# Patient Record
Sex: Female | Born: 1990 | Race: White | Hispanic: No | Marital: Single
Health system: Midwestern US, Community
[De-identification: ages and names within clinical notes are randomized; demographics above are authoritative.]

## PROBLEM LIST (undated history)

## (undated) DIAGNOSIS — B182 Chronic viral hepatitis C: Secondary | ICD-10-CM

---

## 2015-10-12 ENCOUNTER — Encounter (HOSPITAL_COMMUNITY): Payer: Self-pay | Admitting: *Deleted

## 2015-10-12 ENCOUNTER — Emergency Department (HOSPITAL_COMMUNITY)
Admission: EM | Admit: 2015-10-12 | Discharge: 2015-10-13 | Discharge status: Against Medical Advice | Payer: Self-pay | Attending: Emergency Medicine | Admitting: Emergency Medicine

## 2015-10-12 DIAGNOSIS — L03113 Cellulitis of right upper limb: Secondary | ICD-10-CM | POA: Insufficient documentation

## 2015-10-12 DIAGNOSIS — F1910 Other psychoactive substance abuse, uncomplicated: Secondary | ICD-10-CM | POA: Insufficient documentation

## 2015-10-12 DIAGNOSIS — J209 Acute bronchitis, unspecified: Secondary | ICD-10-CM | POA: Insufficient documentation

## 2015-10-12 DIAGNOSIS — F17200 Nicotine dependence, unspecified, uncomplicated: Secondary | ICD-10-CM | POA: Insufficient documentation

## 2015-10-12 HISTORY — DX: Chronic viral hepatitis C: B18.2

## 2015-10-12 MED ORDER — VANCOMYCIN HCL IN DEXTROSE 1-5 GM/200ML-% IV SOLN
1000.0000 mg | Freq: Once | INTRAVENOUS | Status: AC
  Administered 2015-10-13: 1000 mg via INTRAVENOUS
  Filled 2015-10-12: qty 200

## 2015-10-12 MED ORDER — IPRATROPIUM BROMIDE 0.02 % IN SOLN
0.5000 mg | Freq: Once | RESPIRATORY_TRACT | Status: AC
  Administered 2015-10-13: 0.5 mg via RESPIRATORY_TRACT
  Filled 2015-10-12: qty 2.5

## 2015-10-12 NOTE — ED Provider Notes (Signed)
CSN: [id number]     Arrival date & time 10/12/15  2130 History  By signing my name below, I, Phillis Haggis, attest that this documentation has been prepared under the direction and in the presence of Dione Booze, MD. Electronically Signed: Phillis Haggis, ED Scribe. 10/12/2015. 11:38 PM.   Chief Complaint  Patient presents with  . Wound Check   The history is provided by the patient. No language interpreter was used.  HPI Comments: Carla Chambers is a 25 y.o. female with a hx of Hepatitis C who presents to the Emergency Department complaining of a gradually worsening, red wound and rash to the inner right elbow onset 3 days ago. Pt reports worsening pain with bending her elbow. She rates her pain 7/10 at its worst. She has tried to squeeze the area, resulting in yellow, purulent drainage from the wound. Pt is an IV drug user; she states that her last use was 3 days ago. She reports chills and nausea, but believes this is from stopping the IV drug use. She states that she is trying to detox. She states that she has been having a gradually worsening productive cough with black sputum. She reports that she was diagnosed with bronchitis, but it has not gotten better. Pt is a smoker, 0.5 ppd. She denies fever. Pt does not have a PCP; she states that she is from [place]. Pt is allergic to Albuterol, Prednisone, and Xopenex.   Past Medical History  Diagnosis Date  . Hep C w/o coma, chronic (HCC)    History reviewed. No pertinent past surgical history. No family history on file. Social History  Substance Use Topics  . Smoking status: Current Every Day Smoker  . Smokeless tobacco: None  . Alcohol Use: No   OB History    No data available     Review of Systems  Constitutional: Positive for chills. Negative for fever.  Respiratory: Positive for cough.   Gastrointestinal: Positive for nausea.  Skin: Positive for rash and wound.  All other systems reviewed and are negative.  Allergies   Albuterol; Prednisone; Remeron; Xopenex; and Zoloft  Home Medications   Prior to Admission medications   Not on File   BP 138/90 mmHg  Pulse 96  Temp(Src) 98.4 F (36.9 C) (Oral)  Resp 18  Ht  (1.626 m)  Wt 136 lb 7 oz (61.888 kg)  BMI 23.41 kg/m2  SpO2 100%  LMP 08/12/2015 Physical Exam  Constitutional: She is oriented to person, place, and time. She appears well-developed and well-nourished.  HENT:  Head: Normocephalic and atraumatic.  Eyes: EOM are normal. Pupils are equal, round, and reactive to light.  Neck: Normal range of motion. Neck supple. No JVD present.  Cardiovascular: Normal rate, regular rhythm and normal heart sounds.  Exam reveals no gallop and no friction rub.   No murmur heard. Pulmonary/Chest: Effort normal. She has wheezes. She has no rales.  Exhalation phase wheezing with forced exhalation  Abdominal: Soft. Bowel sounds are normal. She exhibits no distension and no mass. There is no tenderness.  Musculoskeletal: Normal range of motion. She exhibits no edema.  Lymphadenopathy:    She has no cervical adenopathy.  Neurological: She is alert and oriented to person, place, and time. No cranial nerve deficit. She exhibits normal muscle tone. Coordination normal.  Skin: Skin is warm and dry.  3x2 cm erythematous area to right antecubital space, crusted and broken skin in central portion, slightly heaped up margins, no crepitus or lymphangitic streaks; needle  tracks present over veins of right forearm; no axillary adenopathy  Psychiatric: She has a normal mood and affect. Her behavior is normal.  Nursing note and vitals reviewed.   ED Course  Procedures (including critical care time) DIAGNOSTIC STUDIES: Oxygen Saturation is 100% on RA, normal by my interpretation.    COORDINATION OF CARE: 11:36 PM-Discussed treatment plan which includes labs, anti-biotics and x-ray with pt at bedside and pt agreed to plan.   Labs Review Results for orders placed or  performed during the hospital encounter of 10/12/15  CBC with Differential  Result Value Ref Range   WBC 6.2 4.0 - 10.5 K/uL   RBC 4.85 3.87 - 5.11 MIL/uL   Hemoglobin 14.6 12.0 - 15.0 g/dL   HCT 29.544.5 28.436.0 - 13.246.0 %   MCV 91.8 78.0 - 100.0 fL   MCH 30.1 26.0 - 34.0 pg   MCHC 32.8 30.0 - 36.0 g/dL   RDW 44.012.3 10.211.5 - 72.515.5 %   Platelets 271 150 - 400 K/uL   Neutrophils Relative % 56 %   Neutro Abs 3.5 1.7 - 7.7 K/uL   Lymphocytes Relative 31 %   Lymphs Abs 1.9 0.7 - 4.0 K/uL   Monocytes Relative 11 %   Monocytes Absolute 0.7 0.1 - 1.0 K/uL   Eosinophils Relative 2 %   Eosinophils Absolute 0.1 0.0 - 0.7 K/uL   Basophils Relative 0 %   Basophils Absolute 0.0 0.0 - 0.1 K/uL  Basic metabolic panel  Result Value Ref Range   Sodium 136 135 - 145 mmol/L   Potassium 4.0 3.5 - 5.1 mmol/L   Chloride 105 101 - 111 mmol/L   CO2 24 22 - 32 mmol/L   Glucose, Bld 83 65 - 99 mg/dL   BUN <5 (L) 6 - 20 mg/dL   Creatinine, Ser 3.660.63 0.44 - 1.00 mg/dL   Calcium 9.1 8.9 - 44.010.3 mg/dL   GFR calc non Af Amer >60 >60 mL/min   GFR calc Af Amer >60 >60 mL/min   Anion gap 7 5 - 15  Sedimentation rate  Result Value Ref Range   Sed Rate 15 0 - 22 mm/hr   Imaging Review Dg Chest 2 View  10/13/2015  CLINICAL DATA:  Cough for 1 month. EXAM: CHEST  2 VIEW COMPARISON:  None. FINDINGS: The heart size and mediastinal contours are within normal limits. Both lungs are clear. The visualized skeletal structures are unremarkable. IMPRESSION: No active cardiopulmonary disease. Electronically Signed   By: Ellery Plunkaniel R Mitchell M.D.   On: 10/13/2015 00:43   Dg Elbow Complete Right  10/13/2015  CLINICAL DATA:  IV drug use.  Elbow wound. EXAM: RIGHT ELBOW - COMPLETE 3+ VIEW COMPARISON:  None. FINDINGS: No soft tissue gas or soft tissue foreign body. Bones and articulations are normal. IMPRESSION: Negative. Electronically Signed   By: Ellery Plunkaniel R Mitchell M.D.   On: 10/13/2015 00:43   I have personally reviewed and evaluated these  images and lab results as part of my medical decision-making.   MDM   Final diagnoses:  Cellulitis of right arm  IV drug abuse  Acute bronchitis, unspecified organism    Lesion in the right forearm appears to be area of cellulitis. Report of pus coming out of it originally is suggesting that there was an abscess there originally. No evidence of abscess currently. She is sent for x-rays to rule out gas-forming in infection. Cough appears to be bronchitic. She relates history of allergy to albuterol but has not received it  since she was 25 years old. On that occasion, she did have a strep infection and the reaction was supposed to be a rash. I question whether the rash I was actually from strep and not from the albuterol. Chest x-rays obtained showing no evidence of pneumonia. She was to receive a dose of IV vancomycin before starting her on doxycycline. However, patient got up and left the department before I had a chance to talk with her and she never received any antibiotics. Because she left without giving me a chance to talk with her, I was not able to prescribe her any antibiotics.  I personally performed the services described in this documentation, which was scribed in my presence. The recorded information has been reviewed and is accurate.       Dione Boozeavid Jania Steinke, MD 10/13/15 [phone]
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
## 2015-10-12 NOTE — ED Notes (Signed)
The pt also wants to be checked .  She had bronchitis 2 months ago and she thinks it never went away

## 2015-10-13 ENCOUNTER — Emergency Department (HOSPITAL_COMMUNITY): Payer: Self-pay

## 2015-10-13 LAB — BASIC METABOLIC PANEL
ANION GAP: 7 (ref 5–15)
BUN: <5 mg/dL — ABNORMAL LOW (ref 6–20)
CALCIUM: 9.1 mg/dL (ref 8.9–10.3)
CO2: 24 mmol/L (ref 22–32)
CREATININE: 0.63 mg/dL (ref 0.44–1.00)
Chloride: 105 mmol/L (ref 101–111)
GLUCOSE: 83 mg/dL (ref 65–99)
Potassium: 4 mmol/L (ref 3.5–5.1)
Sodium: 136 mmol/L (ref 135–145)

## 2015-10-13 LAB — CBC WITH DIFFERENTIAL/PLATELET
BASOS ABS: 0 10*3/uL (ref 0.0–0.1)
BASOS PCT: 0 %
EOS PCT: 2 %
Eosinophils Absolute: 0.1 10*3/uL (ref 0.0–0.7)
HCT: 44.5 % (ref 36.0–46.0)
Hemoglobin: 14.6 g/dL (ref 12.0–15.0)
Lymphocytes Relative: 31 %
Lymphs Abs: 1.9 10*3/uL (ref 0.7–4.0)
MCH: 30.1 pg (ref 26.0–34.0)
MCHC: 32.8 g/dL (ref 30.0–36.0)
MCV: 91.8 fL (ref 78.0–100.0)
MONO ABS: 0.7 10*3/uL (ref 0.1–1.0)
MONOS PCT: 11 %
Neutro Abs: 3.5 10*3/uL (ref 1.7–7.7)
Neutrophils Relative %: 56 %
PLATELETS: 271 10*3/uL (ref 150–400)
RBC: 4.85 MIL/uL (ref 3.87–5.11)
RDW: 12.3 % (ref 11.5–15.5)
WBC: 6.2 10*3/uL (ref 4.0–10.5)

## 2015-10-13 LAB — SEDIMENTATION RATE: Sed Rate: 15 mm/hr (ref 0–22)
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
## 2015-10-13 NOTE — ED Notes (Addendum)
This RN attempted to start and IV on the pt. Pt refused to allow RN to advance the Catheter. This RN attempted to coach the pt through relaxation techniques, unsuccessful.  MD informed that pt is refusing IV antibiotics and another attempt at an IV start.

## 2015-11-20 ENCOUNTER — Ambulatory Visit (HOSPITAL_COMMUNITY)
Admission: EM | Admit: 2015-11-20 | Discharge: 2015-11-20 | Disposition: A | Discharge status: Home / Self Care | Payer: No Typology Code available for payment source | Attending: Emergency Medicine | Admitting: Emergency Medicine

## 2015-11-20 ENCOUNTER — Emergency Department (HOSPITAL_COMMUNITY): Payer: Self-pay

## 2015-11-20 ENCOUNTER — Emergency Department (HOSPITAL_COMMUNITY)
Admission: EM | Admit: 2015-11-20 | Discharge: 2015-11-20 | Disposition: A | Discharge status: Home / Self Care | Payer: Self-pay | Attending: Emergency Medicine | Admitting: Emergency Medicine

## 2015-11-20 ENCOUNTER — Encounter (HOSPITAL_COMMUNITY): Payer: Self-pay | Admitting: Emergency Medicine

## 2015-11-20 DIAGNOSIS — Y999 Unspecified external cause status: Secondary | ICD-10-CM | POA: Insufficient documentation

## 2015-11-20 DIAGNOSIS — M25552 Pain in left hip: Secondary | ICD-10-CM | POA: Insufficient documentation

## 2015-11-20 DIAGNOSIS — Z91040 Latex allergy status: Secondary | ICD-10-CM | POA: Insufficient documentation

## 2015-11-20 DIAGNOSIS — F17200 Nicotine dependence, unspecified, uncomplicated: Secondary | ICD-10-CM | POA: Insufficient documentation

## 2015-11-20 DIAGNOSIS — Y939 Activity, unspecified: Secondary | ICD-10-CM | POA: Insufficient documentation

## 2015-11-20 DIAGNOSIS — Z888 Allergy status to other drugs, medicaments and biological substances status: Secondary | ICD-10-CM | POA: Diagnosis not present

## 2015-11-20 DIAGNOSIS — R51 Headache: Secondary | ICD-10-CM | POA: Diagnosis not present

## 2015-11-20 DIAGNOSIS — R55 Syncope and collapse: Secondary | ICD-10-CM | POA: Insufficient documentation

## 2015-11-20 DIAGNOSIS — T7421XA Adult sexual abuse, confirmed, initial encounter: Secondary | ICD-10-CM | POA: Insufficient documentation

## 2015-11-20 DIAGNOSIS — Y929 Unspecified place or not applicable: Secondary | ICD-10-CM | POA: Insufficient documentation

## 2015-11-20 DIAGNOSIS — M25559 Pain in unspecified hip: Secondary | ICD-10-CM

## 2015-11-20 DIAGNOSIS — Z0441 Encounter for examination and observation following alleged adult rape: Secondary | ICD-10-CM | POA: Diagnosis present

## 2015-11-20 DIAGNOSIS — S0093XA Contusion of unspecified part of head, initial encounter: Secondary | ICD-10-CM | POA: Insufficient documentation

## 2015-11-20 LAB — URINALYSIS, ROUTINE W REFLEX MICROSCOPIC
BILIRUBIN URINE: NEGATIVE
Glucose, UA: NEGATIVE mg/dL
Hgb urine dipstick: NEGATIVE
Ketones, ur: NEGATIVE mg/dL
Leukocytes, UA: NEGATIVE
NITRITE: NEGATIVE
PROTEIN: NEGATIVE mg/dL
SPECIFIC GRAVITY, URINE: 1.023 (ref 1.005–1.030)
pH: 5.5 (ref 5.0–8.0)

## 2015-11-20 LAB — PREGNANCY, URINE: PREG TEST UR: NEGATIVE

## 2015-11-20 MED ORDER — NICOTINE 14 MG/24HR TD PT24
14.0000 mg | MEDICATED_PATCH | Freq: Once | TRANSDERMAL | Status: DC
  Administered 2015-11-20: 14 mg via TRANSDERMAL
  Filled 2015-11-20: qty 1

## 2015-11-20 MED ORDER — CEFTRIAXONE SODIUM 250 MG IJ SOLR
250.0000 mg | Freq: Once | INTRAMUSCULAR | Status: AC
  Administered 2015-11-20: 250 mg via INTRAMUSCULAR
  Filled 2015-11-20: qty 250

## 2015-11-20 MED ORDER — OXYCODONE-ACETAMINOPHEN 5-325 MG PO TABS
1.0000 | ORAL_TABLET | Freq: Once | ORAL | Status: AC
  Administered 2015-11-20: 1 via ORAL
  Filled 2015-11-20: qty 1

## 2015-11-20 MED ORDER — METRONIDAZOLE 500 MG PO TABS
2000.0000 mg | ORAL_TABLET | Freq: Once | ORAL | Status: AC
  Administered 2015-11-20: 2000 mg via ORAL
  Filled 2015-11-20: qty 4

## 2015-11-20 MED ORDER — ALPRAZOLAM 0.25 MG PO TABS
0.5000 mg | ORAL_TABLET | Freq: Once | ORAL | Status: AC | PRN
  Administered 2015-11-20: 0.5 mg via ORAL
  Filled 2015-11-20: qty 2

## 2015-11-20 MED ORDER — LIDOCAINE HCL (PF) 1 % IJ SOLN
0.9000 mL | Freq: Once | INTRAMUSCULAR | Status: AC
  Administered 2015-11-20: 0.9 mL
  Filled 2015-11-20: qty 5

## 2015-11-20 MED ORDER — DIAZEPAM 5 MG PO TABS
5.0000 mg | ORAL_TABLET | Freq: Once | ORAL | Status: AC
  Administered 2015-11-20: 5 mg via ORAL
  Filled 2015-11-20: qty 1

## 2015-11-20 MED ORDER — ONDANSETRON 4 MG PO TBDP
4.0000 mg | ORAL_TABLET | Freq: Once | ORAL | Status: AC
Start: 2015-11-20 — End: 2015-11-20
  Administered 2015-11-20: 4 mg via ORAL
  Filled 2015-11-20: qty 1

## 2015-11-20 MED ORDER — AZITHROMYCIN 250 MG PO TABS
1000.0000 mg | ORAL_TABLET | Freq: Once | ORAL | Status: AC
  Administered 2015-11-20: 1000 mg via ORAL
  Filled 2015-11-20: qty 4

## 2015-11-20 MED ORDER — ULIPRISTAL ACETATE 30 MG PO TABS
30.0000 mg | ORAL_TABLET | Freq: Once | ORAL | Status: AC
Start: 2015-11-20 — End: 2015-11-20
  Administered 2015-11-20: 30 mg via ORAL
  Filled 2015-11-20: qty 1
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
## 2015-11-20 NOTE — ED Notes (Signed)
Meds pulled by sane RN
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
## 2016-12-18 DIAGNOSIS — F439 Reaction to severe stress, unspecified: Secondary | ICD-10-CM
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
## 2016-12-18 NOTE — ED Notes (Signed)
[facility] speaking to the patient in Triage1.

## 2016-12-18 NOTE — Other (Signed)
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  Medical doctor is in agreement with psychiatrist disposition because this counselor conveyed to ED physician the recommendation of the on-call psychiatrist and they concurred.   The plan is to discharge to care of group home manager, register for services at [facility] in the morning, and attend NA meeting in the afternoon.  The on-call Psychiatrist consulted was Dr. Latanya Maudlin.  The admitting Psychiatrist will be Dr. Gretta Cool.  The admitting Diagnosis is n/a.  The Payor source is Medical sales representative - [facility]        Section II - Integrated Summary  Summary:    Patient is a 26 yo white female who arrives at ED accompanied by group home worker/Jessica with chief complaint of feeling like she was going to hurt herself after an argument with her girlfriend. However, patient denies SI/HI at time of ED admission, as well as during [facility] assessment. She reports history of self-harm but has not done so lately, with no evidence of same. Patient contracts for safety saying, "I was just saying reckless shit" and reports no longer feeling as though she will hurt herself but her group home worker wants her evaluated.   Patient also admits to recent relapse of heroin use prior to and after argument with girlfriend. Patient says, "I know I screwed up. I was just upset but I have a NA meeting tomorrow that I promise to go to. Everything is calm now. I'm going to call my sponsor when I get out of here."   Patient denies auditory/visual hallucinations, is not delusional, and is  oriented X4. Patient's ETOH, drug screen, and pregnancy test were not ordered. 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  Patient presents as frustrated and embarrassed with herself and appears appropriately remorseful regarding her relapse of Heroin use.  Patient has history of a psych admission to [facility] when she was 26 yo for depression after her grandfather died. She reports one previous suicide attempt by drug overdose at that time but none since then. She is currently not followed by a psychiatrist or counselor. She is prescribed Trazodone 150mg  by her PCP Dr. Danny Lawless but ran out yesterday and needs to get a refill. She is also on her last dose of Prednisone that she was prescribed for back pain.   Patient has stress and anxiety due to substance abuse and argument with girlfriend but no suicidal thoughts, plans, or impulses, and is not considered a suicidal risk at this time.  The plan is to discharge to care of group home manager, register for services at [facility] in the morning, and attend NA meeting in the afternoon.       The patient has demonstrated mental capacity to provide informed consent.  The information is given by  the patient and group home staff.  The Chief Complaint is argument with girlfriend ad relapse of heroin use.  The Precipitant Factors are substance abuse.  [facility] when she was 26 [facility] for depression  The patient has not previously been in restraints.  Current Psychiatrist and/or Case Manager is n/a.    Lethality Assessment:    The potential for suicide noted by the following: previous history of attempts which occurred at age 26 in the form(s) of drug overdose, and current substance abuse.  The potential for homicide is not noted.   The patient has not been a perpetrator of sexual or physical abuse.  There are not pending charges.  The patient is not felt to be at risk for self harm or harm to others.  The attending nurse was advised no further monitoring is necessary at this time.    Section III - Psychosocial  The patient's overall mood and attitude is frustrated and embarrassed.  Feelings of helplessness and hopelessness are not observed.  Generalized anxiety is not observed.  Panic is not observed. Phobias are not observed.  Obsessive compulsive tendencies are not observed.      Section IV - Mental Status Exam  The patient's appearance shows no evidence of impairment.  The patient's behavior is restless. The patient is oriented to time, place, person and situation.  The patient's speech shows no evidence of impairment.  The patient's mood is anxious.  The range of affect shows no evidence of impairment.  The patient's thought content demonstrates no evidence of impairment.  The thought process shows no evidence of impairment.  The patient's perception shows no evidence of impairment. The patient's memory shows no evidence of impairment.  The patient's appetite shows no evidence of impairment.  The patient's sleep shows no evidence of impairment. The patient's insight is blaming.  The patient's judgement shows no evidence of impairment.        Section V - Substance Abuse  The patient is using substances.  The patient is using heroin IV for 5-10 years with last use on 12/18/16. The patient has experienced the following withdrawal symptoms: N/A.      Section VI - Living Arrangements  The patient is single.  The patient lives at group home. The patient has no children.  The patient does plan to return home upon discharge.  The patient does not have legal issues pending. The patient's source of income comes from employment.  Religious and cultural practices have not been voiced at this time.     The patient's greatest support comes from NA sponsor/Ashley and this person will be involved with the treatment.    The patient has not been in an event described as horrible or outside the realm of ordinary life experience either currently or in the past.  The patient has not been a victim of sexual/physical abuse.    Section VII - Other Areas of Clinical Concern  The highest grade achieved is 1 year college with the overall quality of school experience being described as ok.  The patient is currently employed and speaks [place] as a primary language.  The patient has no communication impairments affecting communication. The patient's preference for learning can be described as: can read and write adequately.  The patient's hearing is normal.  The patient's vision is normal.      Sheffield Sliderobert S Korben Carcione, LPC

## 2016-12-19 ENCOUNTER — Inpatient Hospital Stay
Admit: 2016-12-19 | Discharge: 2016-12-19 | Disposition: A | Discharge status: Home / Self Care | Payer: PRIVATE HEALTH INSURANCE | Attending: Emergency Medicine

## 2016-12-19 NOTE — ED Notes (Signed)
Pt denies SI/HI. Patient agreed to contract for safety. ' I'm fine now. I was just angry earlier." Pt's belongings at the nurses's station, room close for close monitoring. Will continue to monitor pt.

## 2016-12-19 NOTE — ED Provider Notes (Signed)
HPI Comments: [name] with hx of substance abuse here for mental health evaluation.  Pt currently in recovery house and found out girlfriend was cheating on her.  States she was upset and made comments about harming herself.  States "I was just saying wreck less shit".  Pt denies SI/HI.    Denies any attempt of harming herself.    Admits to using herion 2 days ago and again today.    Denies fever, cough, CP, SOB, abd pain, flank pain, urinary symptoms.   +Smoker.        Patient is a 26 y.o. female presenting with mental health disorder. The history is provided by the patient.   Mental Health Problem    Pertinent negatives include no seizures, no unresponsiveness and no self-injury. Mental status baseline is normal.         History reviewed. No pertinent past medical history.    Past Surgical History:   Procedure Laterality Date   ??? HX APPENDECTOMY     ??? HX TONSILLECTOMY           History reviewed. No pertinent family history.    Social History     Social History   ??? Marital status: SINGLE     Spouse name: N/A   ??? Number of children: N/A   ??? Years of education: N/A     Occupational History   ??? Not on file.     Social History Main Topics   ??? Smoking status: Current Every Day Smoker     Packs/day: 0.25     Years: 10.00   ??? Smokeless tobacco: Not on file   ??? Alcohol use No   ??? Drug use: No   ??? Sexual activity: Not on file     Other Topics Concern   ??? Not on file     Social History Narrative         ALLERGIES: Review of patient's allergies indicates no known allergies.    Review of Systems   Constitutional: Negative for activity change and fever.   HENT: Negative for facial swelling.    Eyes: Negative for discharge.   Respiratory: Negative for cough.    Cardiovascular: Negative for leg swelling.   Gastrointestinal: Negative for abdominal distention.   Skin: Negative for color change.   Neurological: Negative for seizures and syncope.   Psychiatric/Behavioral: Negative for self-injury.       Vitals:    12/18/16 2256    BP: (!) 138/93   Pulse: (!) 116   Resp: 16   Temp: 97.6 ??F (36.4 ??C)   SpO2: 97%   Weight: 77.7 kg (171 lb 3.2 oz)   Height: 5\' 3"  (1.6 m)            Physical Exam   Constitutional: She is oriented to person, place, and time. She appears well-nourished.   HENT:   Head: Normocephalic and atraumatic.   Eyes: Pupils are equal, round, and reactive to light.   Neck: Normal range of motion.   Cardiovascular: Regular rhythm.    HR 110's   Pulmonary/Chest: Effort normal and breath sounds normal.   Abdominal: Soft. There is no tenderness.   Musculoskeletal: Normal range of motion. She exhibits no edema or tenderness.   Neurological: She is alert and oriented to person, place, and time.   Skin: Skin is warm and dry.   Psychiatric: She has a normal mood and affect. Her speech is normal and behavior is normal. She expresses no suicidal  plans.   Cooperative while in ER   Nursing note and vitals reviewed.       MDM  Number of Diagnoses or Management Options  Stress reaction:   Substance abuse:      Amount and/or Complexity of Data Reviewed  Obtain history from someone other than the patient: yes  Discuss the patient with other providers: yes          ED Course       Procedures    [facility] in to see pt; pt denies SI/HI; states she was upset secondary to circumstance; lives in recovery home; pt to be discharged there; will return if any changes or worsening of symptoms. Veverly FellsAlyse N Grainne Knights, PA    Discussed case with attending Physician Mindi SlickerAzie.  Agrees with care and will D/C with follow up.      Patient's results have been reviewed with them.  Patient and/or family have verbally conveyed their understanding and agreement of the patient's signs, symptoms, diagnosis, treatment and prognosis and additionally agree to follow up as recommended or return to the Emergency Room should their condition change prior to follow-up.  Discharge instructions have also been provided to the patient with some educational information regarding  their diagnosis as well a list of reasons why they would want to return to the ER prior to their follow-up appointment should their condition change.  Veverly FellsAlyse N Gessica Jawad, PA

## 2016-12-19 NOTE — ED Notes (Addendum)
Discharge instructions given to patient by provider with opportunity to ask questions. Pt verbalizing understanding. VSS. NAD. Pt left ED ambulatory with companion

## 2016-12-19 NOTE — ED Notes (Signed)
Attempted to call patient back to treatment room; friend states that the patient has gone to the vending machine.
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
## 2017-06-18 DIAGNOSIS — F1194 Opioid use, unspecified with opioid-induced mood disorder: Principal | ICD-10-CM

## 2017-06-18 NOTE — ED Notes (Signed)
Pt changed into gown, belongings secured at [facility].  Female companion present with patient.  [employer] Officer Rogelia BogaButcher made aware of patient.  Dr. Charleen KirksLeery to bedside.
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
## 2017-06-19 ENCOUNTER — Inpatient Hospital Stay
Admit: 2017-06-19 | Discharge: 2017-06-21 | Disposition: A | Discharge status: Home / Self Care | Payer: MEDICAID | Attending: Psychiatry | Admitting: Psychiatry

## 2017-06-19 ENCOUNTER — Emergency Department: Admit: 2017-06-19 | Payer: MEDICAID | Primary: Family Medicine

## 2017-06-19 LAB — EKG 12-LEAD
Atrial Rate: 85 {beats}/min
Diagnosis: NORMAL
P Axis: 26 degrees
P-R Interval: 164 ms
Q-T Interval: 342 ms
QRS Duration: 76 ms
QTc Calculation (Bazett): 406 ms
R Axis: 54 degrees
T Axis: 35 degrees
Ventricular Rate: 85 {beats}/min

## 2017-06-19 LAB — METABOLIC PANEL, COMPREHENSIVE
A-G Ratio: 0.9 — ABNORMAL LOW (ref 1.1–2.2)
ALT (SGPT): 24 U/L (ref 12–78)
AST (SGOT): 17 U/L (ref 15–37)
Albumin: 4.1 g/dL (ref 3.5–5.0)
Alk. phosphatase: 78 U/L (ref 45–117)
Anion gap: 10 mmol/L (ref 5–15)
BUN/Creatinine ratio: 6 — ABNORMAL LOW (ref 12–20)
BUN: 6 MG/DL (ref 6–20)
Bilirubin, total: 0.6 MG/DL (ref 0.2–1.0)
CO2: 29 mmol/L (ref 21–32)
Calcium: 9.3 MG/DL (ref 8.5–10.1)
Chloride: 102 mmol/L (ref 97–108)
Creatinine: 0.93 MG/DL (ref 0.55–1.02)
GFR est AA: >60 mL/min/{1.73_m2} (ref >60)
GFR est non-AA: >60 mL/min/{1.73_m2} (ref >60)
Globulin: 4.7 g/dL — ABNORMAL HIGH (ref 2.0–4.0)
Glucose: 94 mg/dL (ref 65–100)
Potassium: 3.6 mmol/L (ref 3.5–5.1)
Protein, total: 8.8 g/dL — ABNORMAL HIGH (ref 6.4–8.2)
Sodium: 141 mmol/L (ref 136–145)

## 2017-06-19 LAB — EKG, 12 LEAD, INITIAL
Atrial Rate: 85 {beats}/min
Calculated P Axis: 26 degrees
Calculated R Axis: 54 degrees
Calculated T Axis: 35 degrees
Diagnosis: NORMAL
P-R Interval: 164 ms
Q-T Interval: 342 ms
QRS Duration: 76 ms
QTC Calculation (Bezet): 406 ms
Ventricular Rate: 85 {beats}/min

## 2017-06-19 LAB — CBC WITH AUTOMATED DIFF
ABS. BASOPHILS: 0 10*3/uL (ref 0.0–0.1)
ABS. EOSINOPHILS: 0.2 10*3/uL (ref 0.0–0.4)
ABS. IMM. GRANS.: 0 10*3/uL (ref 0.00–0.04)
ABS. LYMPHOCYTES: 2.4 10*3/uL (ref 0.8–3.5)
ABS. MONOCYTES: 0.7 10*3/uL (ref 0.0–1.0)
ABS. NEUTROPHILS: 5.5 10*3/uL (ref 1.8–8.0)
ABSOLUTE NRBC: 0 10*3/uL (ref 0.00–0.01)
BASOPHILS: 0 % (ref 0–1)
EOSINOPHILS: 2 % (ref 0–7)
HCT: 42.4 % (ref 35.0–47.0)
HGB: 13.9 g/dL (ref 11.5–16.0)
IMMATURE GRANULOCYTES: 0 % (ref 0.0–0.5)
LYMPHOCYTES: 27 % (ref 12–49)
MCH: 29.4 PG (ref 26.0–34.0)
MCHC: 32.8 g/dL (ref 30.0–36.5)
MCV: 89.8 FL (ref 80.0–99.0)
MONOCYTES: 8 % (ref 5–13)
MPV: 9.2 FL (ref 8.9–12.9)
NEUTROPHILS: 63 % (ref 32–75)
NRBC: 0 PER 100 WBC
PLATELET: 272 10*3/uL (ref 150–400)
RBC: 4.72 M/uL (ref 3.80–5.20)
RDW: 12.6 % (ref 11.5–14.5)
WBC: 8.8 10*3/uL (ref 3.6–11.0)

## 2017-06-19 LAB — URINALYSIS W/MICROSCOPIC
Glucose: NEGATIVE mg/dL
Nitrites: NEGATIVE
Protein: 30 mg/dL — AB
RBC: >100 /hpf — ABNORMAL HIGH (ref 0–5)
Specific gravity: 1.022 (ref 1.003–1.030)
Urobilinogen: 1 EU/dL (ref 0.2–1.0)
pH (UA): 5.5 (ref 5.0–8.0)

## 2017-06-19 LAB — DRUG SCREEN, URINE
AMPHETAMINES: NEGATIVE
BARBITURATES: NEGATIVE
BENZODIAZEPINES: NEGATIVE
COCAINE: NEGATIVE
METHADONE: NEGATIVE
OPIATES: POSITIVE — AB
PCP(PHENCYCLIDINE): NEGATIVE
THC (TH-CANNABINOL): POSITIVE — AB

## 2017-06-19 LAB — BILIRUBIN, CONFIRM: Bilirubin UA, confirm: NEGATIVE

## 2017-06-19 LAB — HCG URINE, QL. - POC: Pregnancy test,urine (POC): NEGATIVE

## 2017-06-19 LAB — ETHYL ALCOHOL: ALCOHOL(ETHYL),SERUM: <10 MG/DL (ref <10)

## 2017-06-19 LAB — SALICYLATE: Salicylate level: 1.8 MG/DL — ABNORMAL LOW (ref 2.8–20.0)

## 2017-06-19 LAB — ACETAMINOPHEN: Acetaminophen level: <2 ug/mL — ABNORMAL LOW (ref 10–30)

## 2017-06-19 LAB — TSH 3RD GENERATION: TSH: 2.04 u[IU]/mL (ref 0.36–3.74)

## 2017-06-19 MED ORDER — ZOLPIDEM 10 MG TAB
10 mg | Freq: Every evening | ORAL | Status: DC | PRN
Start: 2017-06-19 — End: 2017-06-21
  Administered 2017-06-21: 01:00:00 via ORAL

## 2017-06-19 MED ORDER — KETOROLAC TROMETHAMINE 30 MG/ML INJECTION
30 mg/mL (1 mL) | INTRAMUSCULAR | Status: AC
Start: 2017-06-19 — End: 2017-06-19
  Administered 2017-06-19: 05:00:00 via INTRAVENOUS

## 2017-06-19 MED ORDER — IBUPROFEN 400 MG TAB
400 mg | Freq: Three times a day (TID) | ORAL | Status: DC | PRN
Start: 2017-06-19 — End: 2017-06-21
  Administered 2017-06-21: 10:00:00 via ORAL

## 2017-06-19 MED ORDER — NICOTINE 7 MG/24 HR DAILY PATCH
7 mg/24 hr | TRANSDERMAL | Status: DC
Start: 2017-06-19 — End: 2017-06-19

## 2017-06-19 MED ORDER — SODIUM CHLORIDE 0.9% BOLUS IV
0.9 % | Freq: Once | INTRAVENOUS | Status: AC
Start: 2017-06-19 — End: 2017-06-19
  Administered 2017-06-19: 05:00:00 via INTRAVENOUS

## 2017-06-19 MED ORDER — WATER FOR INJECTION, STERILE INJECTION
20. mg/mL (final conc.) | Freq: Two times a day (BID) | INTRAMUSCULAR | Status: DC | PRN
Start: 2017-06-19 — End: 2017-06-21

## 2017-06-19 MED ORDER — METHADONE 10 MG TAB
10 mg | Freq: Every day | ORAL | Status: AC
Start: 2017-06-19 — End: 2017-06-21
  Administered 2017-06-20 – 2017-06-21 (×2): via ORAL

## 2017-06-19 MED ORDER — OLANZAPINE 5 MG TAB
5 mg | Freq: Four times a day (QID) | ORAL | Status: DC | PRN
Start: 2017-06-19 — End: 2017-06-21

## 2017-06-19 MED ORDER — METHADONE 10 MG TAB
10 mg | Freq: Once | ORAL | Status: AC
Start: 2017-06-19 — End: 2017-06-19
  Administered 2017-06-19: 22:00:00 via ORAL

## 2017-06-19 MED ORDER — NICOTINE 21 MG/24 HR DAILY PATCH
21 mg/24 hr | Freq: Every day | TRANSDERMAL | Status: DC | PRN
Start: 2017-06-19 — End: 2017-06-20

## 2017-06-19 MED ORDER — CEFUROXIME AXETIL 250 MG TAB
250 mg | Freq: Once | ORAL | Status: AC
Start: 2017-06-19 — End: 2017-06-19
  Administered 2017-06-19: 09:00:00 via ORAL

## 2017-06-19 MED ORDER — BENZTROPINE 1 MG/ML IJ SOLN
1 mg/mL | Freq: Two times a day (BID) | INTRAMUSCULAR | Status: DC | PRN
Start: 2017-06-19 — End: 2017-06-21

## 2017-06-19 MED ORDER — BENZTROPINE 2 MG TAB
2 mg | Freq: Two times a day (BID) | ORAL | Status: DC | PRN
Start: 2017-06-19 — End: 2017-06-21

## 2017-06-19 MED ORDER — ACETAMINOPHEN 325 MG TABLET
325 mg | ORAL | Status: DC | PRN
Start: 2017-06-19 — End: 2017-06-21

## 2017-06-19 MED ORDER — MAGNESIUM HYDROXIDE 400 MG/5 ML ORAL SUSP
400 mg/5 mL | Freq: Every day | ORAL | Status: DC | PRN
Start: 2017-06-19 — End: 2017-06-21

## 2017-06-19 MED FILL — NICOTINE 7 MG/24 HR DAILY PATCH: 7 mg/24 hr | TRANSDERMAL | Qty: 1

## 2017-06-19 MED FILL — SODIUM CHLORIDE 0.9 % IV: INTRAVENOUS | Qty: 1000

## 2017-06-19 MED FILL — NICOTINE 21 MG/24 HR DAILY PATCH: 21 mg/24 hr | TRANSDERMAL | Qty: 1

## 2017-06-19 MED FILL — KETOROLAC TROMETHAMINE 30 MG/ML INJECTION: 30 mg/mL (1 mL) | INTRAMUSCULAR | Qty: 1

## 2017-06-19 MED FILL — METHADONE 10 MG TAB: 10 mg | ORAL | Qty: 3

## 2017-06-19 MED FILL — CEFUROXIME AXETIL 250 MG TAB: 250 mg | ORAL | Qty: 2

## 2017-06-19 NOTE — Behavioral Health Treatment Team (Signed)
TRANSFER - IN REPORT:  1251  Verbal report received from Dollar GeneralKelly RN (name) on Terry Ho  being [facility] for routine progression of care      Report consisted of patient???s Situation, Background, Assessment and   Recommendations(SBAR).     Information from the following report(s) SBAR was reviewed with the receiving nurse.    Opportunity for questions and clarification was provided.      Assessment completed upon patient???s arrival to unit and care assumed.

## 2017-06-19 NOTE — ED Notes (Signed)
Introduced self to patient.  Left PIV removed by this RN.  Breakfast tray ordered.  Pt c/o slight chest pain.  Phone and glasses at bedside.
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
## 2017-06-19 NOTE — Progress Notes (Signed)
Admission Medication Reconciliation:    Information obtained from: patient, rx query, chart review    Significant PMH/Disease States:   History reviewed. No pertinent past medical history.    Chief Complaint for this Admission:  mental health    Allergies:  Patient has no known allergies.    Prior to Admission Medications:   Prior to Admission Medications   Prescriptions Last Dose Informant Patient Reported? Taking?   traZODone (DESYREL) 100 mg tablet Unknown at Unknown time  Yes No   Sig: Take 100 mg by mouth nightly.      Facility-Administered Medications: None         Comments/Recommendations: Removed ketorolac and flexeril. Patient believes that she has "some of these" at home, but questionable adherence. Of note, took unprescribed xanax during relapse.

## 2017-06-19 NOTE — ED Notes (Signed)
Pt eating lunch then will transport to floor

## 2017-06-19 NOTE — Behavioral Health Treatment Team (Signed)
Pt voluntarily admitted to the service of Dr. Maxwell Marioneeba. Skin assessment completed by writer and Gerre PebblesAngela Shibley, RN. Pt stated she has been depressed and using heroine and fentanyl. Also, positive for THC.  Placed on Q 15 minute safety checks.

## 2017-06-19 NOTE — ED Notes (Signed)
Pt in room sleeping being monitored x3.

## 2017-06-19 NOTE — Other (Signed)
TRANSFER - OUT REPORT:    Verbal report given to Mardene CelesteJoanna, Charity fundraiserN (name) on Terry Ho  being transferred to [facility] (unit) for routine progression of care       Report consisted of patient???s Situation, Background, Assessment and   Recommendations(SBAR).     Information from the following report(s) SBAR, ED Summary, [facility] and Recent Results was reviewed with the receiving nurse.    Lines:       Opportunity for questions and clarification was provided.      Patient transported with:  RN & [employer] officer

## 2017-06-19 NOTE — Other (Signed)
Went to see this pt this am.  Pt is angry and frustrated that she used recently and reported these details at a meeting and she was sent to this facility.  Pt this am stated that she wants help for her drug usage and wants to stop but now that she started using, she doesn't know if she can do it on her own.  Pt is tearful and worried about being discharged.  Pt is wanting mental health services and states that there are no services at her recovery house where she has only resided since Friday.  Pt provided a worker's name and number with whom this evaluator spoke with.   There are no services currently for this pt re: counseling and psychiatric.  Spoke with a worker thru [employer] who works with [employer] and was put thru to [employer].  Left a message and hope to receive a call back to find out what kind of services this pt could potentially receive.  Pt is resting comfortably in bed at this time.  Duane, overnight BSMART, has sent paperwork [facility] for [facility].  Will keep nursing staff informed of placement possibilities.  Rhunette CroftSarah Hartlyn Reigel, LCSW

## 2017-06-19 NOTE — Progress Notes (Signed)
Problem: Depressed Mood (Adult/Pediatric)  Goal: *STG: Participates in treatment plan  Outcome: Not Progressing Towards Goal  Variance: Patient Condition  Pt is not vested in treatment.  She is polite during exchanges with staff, but does not participate in therapeutic activities.  She estays in her room and comes out to eat and for med pass.

## 2017-06-19 NOTE — ED Notes (Signed)
Bedside and Verbal shift change report given to Olivia & Kelly, RN (oncoming nurse) by Courtney, RN (offgoing nurse). Report included the following information SBAR, ED Summary, MAR and Recent Results.

## 2017-06-19 NOTE — ED Notes (Signed)
Spoke with Terry Ho, BSMART. [facility]
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
## 2017-06-20 LAB — CULTURE, URINE
Colonies Counted: >100000
Colony Count: >100000

## 2017-06-20 MED ORDER — ONDANSETRON (PF) 4 MG/2 ML INJECTION
4 mg/2 mL | Freq: Four times a day (QID) | INTRAMUSCULAR | Status: DC | PRN
Start: 2017-06-20 — End: 2017-06-21

## 2017-06-20 MED ORDER — LOPERAMIDE 2 MG CAP
2 mg | ORAL | Status: DC | PRN
Start: 2017-06-20 — End: 2017-06-21

## 2017-06-20 MED ORDER — CLONIDINE 0.1 MG TAB
0.1 mg | Freq: Three times a day (TID) | ORAL | Status: DC
Start: 2017-06-20 — End: 2017-06-21
  Administered 2017-06-21 (×2): via ORAL

## 2017-06-20 MED ORDER — NICOTINE (POLACRILEX) 2 MG GUM
2 mg | BUCCAL | Status: DC | PRN
Start: 2017-06-20 — End: 2017-06-21
  Administered 2017-06-20 – 2017-06-21 (×3): via ORAL

## 2017-06-20 MED ORDER — PROMETHAZINE 25 MG TAB
25 mg | Freq: Four times a day (QID) | ORAL | Status: DC | PRN
Start: 2017-06-20 — End: 2017-06-21
  Administered 2017-06-20 – 2017-06-21 (×2): via ORAL

## 2017-06-20 MED ORDER — CLONIDINE 0.1 MG TAB
0.1 mg | Freq: Once | ORAL | Status: AC
Start: 2017-06-20 — End: 2017-06-20
  Administered 2017-06-20: 18:00:00 via ORAL

## 2017-06-20 MED FILL — PROMETHAZINE 25 MG TAB: 25 mg | ORAL | Qty: 1

## 2017-06-20 MED FILL — NICOTINE (POLACRILEX) 2 MG GUM: 2 mg | BUCCAL | Qty: 2

## 2017-06-20 MED FILL — CLONIDINE 0.1 MG TAB: 0.1 mg | ORAL | Qty: 1

## 2017-06-20 MED FILL — METHADONE 10 MG TAB: 10 mg | ORAL | Qty: 1

## 2017-06-20 MED FILL — NICOTINE 21 MG/24 HR DAILY PATCH: 21 mg/24 hr | TRANSDERMAL | Qty: 1

## 2017-06-20 NOTE — Behavioral Health Treatment Team (Cosign Needed)
GROUP THERAPY PROGRESS NOTE    [place] [facility]     Group time: 50 minutes    Personal goal for participation: "detox from heroin"    Goal orientation: personal    Group therapy participation: active    Therapeutic interventions reviewed and discussed:     Impression of participation:
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
## 2017-06-20 NOTE — Progress Notes (Addendum)
[place]  Master Treatment Plan for Terry Ho    Date Treatment Plan Initiated: 06/20/17    Treatment Plan Modalities:  Type of Modality Amount  (x minutes) Frequency (x/week) Duration (x days) Name of Responsible Staff   Community & wrap-up meetings to encourage peer interactions 15 7 1  Rickia K     Group psychotherapy to assist in building coping skills and internal controls 60 7 1 Bill Fraker   Therapeutic activity groups to build coping skills 60 7 1 Bill Fraker   Psychoeducation in group setting to address:   Medication education   15 7 1  Kim P   Coping skills         Relaxation techniques         Symptom management         Discharge planning   60 2 [place]   Spirituality    11 2 1 [place]   60 1 1 volunteer   Conservator, museum/gallery   Physician medication management   15 7 1  Dr. Alan Mulder   Family meeting/discharge planning   15 2 1  Marlise Eves and Vito Berger                                        These goals will be met by 06/23/17      Problem: Depressed Mood (Adult/Pediatric)   Goal: *STG: Participates in treatment plan  Outcome: Progressing Towards Goal  Out on unit social w peers and staff. Mood and affect frustrated and annoyed. Reports mood is direct link to withdrawal from poly substances. States she is glad she is sober and confident she can continue with withdrawal and successful stay sober once d/c home. Daily goal is to delete drug contacts from phone and list about 10 activites she can do to distract herself from urges once home.   Goal: *STG: Verbalizes anger, guilt, and other feelings in a constructive manor  Outcome: Progressing Towards Goal  Annoyed and disappointed in herself related to her addiction and behaviors.   Goal: *STG: Attends activities and groups  Outcome: Progressing Towards Goal  engaged  Goal: Interventions  Outcome: Progressing Towards Goal  Staff focus is on offering support, encourage progress towards goals and  coping skills educaiton      Problem: Chemical Dependency (Adult/Pediatric)  Goal: *STG: Seeks staff when symptoms of withdrawal increase  Outcome: Progressing Towards Goal  withdrawals does not indicate prn medications. Pt states its tolerable at this time.   Goal: *STG: Will identify negative impact of chemical dependency including the use of tobacco, alcohol, and other substances  Outcome: Resolved/Met Date Met: 06/20/17  Able to list multiple   Goal: Interventions  Outcome: Progressing Towards Goal  Staff focus is on offering support, encourage progress towards goals and coping skills educaiton    1230: COW 13 medication order to address symptoms such as motrin, catapress and atarax. Pt voices wanting to be admitted to a inpatient detox facility to better meet her needs. States she was under the impression that we were a detox facility. Requested discharge to detox and or inpatient facility. Encouraged pt to voice symptoms to med nurse and received ordered prn.  Pt verbalized understanding

## 2017-06-20 NOTE — Behavioral Health Treatment Team (Signed)
2010: PRN Medication Documentation    Specific patient behavior that led to need for PRN medication: Pt requested medication.  PRN medication given: Remus Lofflerambien

## 2017-06-20 NOTE — Progress Notes (Addendum)
Problem: Chemical Dependency (Adult/Pediatric)  Goal: *STG: Seeks staff when symptoms of withdrawal increase  1805 Phenergan 25 mg given for c/o nausea.    1905  No further c/o nausea.

## 2017-06-20 NOTE — Progress Notes (Signed)
Problem: Discharge Planning  Goal: *Discharge to safe environment  Outcome: Not Progressing Towards Goal  Pt plans to d/c to in pt sa tx program or [facility].  Goal: *Knowledge of medication management  Outcome: Progressing Towards Goal  Pt will take medications for detox and other s/x of withdrawals  Goal: *Knowledge of discharge instructions  Outcome: Progressing Towards Goal  Pt will participate in d/c planning with Team and she will articulate her needs and concerns

## 2017-06-20 NOTE — Progress Notes (Signed)
Problem: Depressed Mood (Adult/Pediatric)  Goal: *STG: Remains safe in hospital  Outcome: Progressing Towards Goal  Patient is new to the General Unit.  She has remained safe since arrival but has not started participating in unit activities.  She remains in her room except for meals and meds.  She is currently sleeping, no stress noted
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
## 2017-06-20 NOTE — Other (Addendum)
Behavioral Health Interdisciplinary Rounds     Patient Name: Smith MinceBrittany Babineaux  Age: 27 y.o.  Room/Bed:  731/02  Primary Diagnosis: <principal problem not specified>   Admission Status: Voluntary     Readmission within 30 days: no  Power of Attorney in place: no  Patient requires a blocked bed: no          Reason for blocked bed:     VTE Prophylaxis: No    Mobility needs/Fall risk: no  Flu Vaccine : no   Nutritional Plan: no  Consults:          Labs/Testing due today?: no    Sleep hours:  11.0      Participation in Care/Groups:  no  Medication Compliant?: Yes  PRNS (last 24 hours): None    Restraints (last 24 hours):  no     CIWA (range last 24 hours):     COWS (range last 24 hours):      Alcohol screening (AUDIT) completed -   AUDIT Score: 0     If applicable, date SBIRT discussed in treatment team AND documented:   AUDIT Screen Score: AUDIT Score: 0      Tobacco - patient is a smoker: Have You Used Tobacco in the Past 30 Days: Yes  Illegal Drugs use: Have You Used Any Illegal Substances Over the Past 12 Months: Yes    24 hour chart check complete: no     Patient goal(s) for today: Discuss issues and goals for hospitalization  Treatment team focus/goals: Complete psych social and substance assessment  Progress note: Alert, tearful, irritable moods ( cravings) and requesting detox and transfer to in pt Sa Tx     LOS:  1  Expected LOS:     Financial concerns/prescription coverage:  no  Date of last family contact:       Family requesting physician contact today:  no  Discharge plan: D/C Door- Door In Pt [place] Tx  Guns in the home:  N/a       Outpatient provider(s): None    Participating treatment team members: EthiopiaBrittany Lungren, Dr Olga Millerseeba, J Lurey RN, Oliver PilaN Burton SW and Burtis JunesA Smith PharmD

## 2017-06-20 NOTE — Behavioral Health Treatment Team (Signed)
GROUP THERAPY PROGRESS NOTE    Smith MinceBrittany Bily is participating in Relaxation group..     Group time: 30 minutes    Personal goal for participation:    Goal orientation: community    Group therapy participation: active    Therapeutic interventions reviewed and discussed:     Impression of participation: Patient watched TV with peers,interacts well with peers group,pleasant,cooperative,laughing and joking with peers.

## 2017-06-20 NOTE — Behavioral Health Treatment Team (Signed)
GROUP THERAPY PROGRESS NOTE    GrenadaBrittany Diguglielmo participated in a morning Process Group on the General Unit with a focus identifying feelings, planning for the day, and learning more about Healing the Inner Child and understanding symptoms.  .  Group time: 85 minutes.     Personal goal for participation: To increase the capacity to improve one???s mood, set personal goals, and understand more about basic activities to help regulate emotions, particularly for trauma survivors.       Goal orientation: The patients will be able to identify their feelings and develop a plan for   structuring their day. They were also presented with a three sheet handout focusing on the concept of the wounded inner child, reconnecting and understanding your childhood thoughts, forgiving and remembering, along with two writing exercises regarding 1) building connections and 2) expressing forgiveness. The role of symptoms as a source of investigation as a half-way solution was discussed.          Group therapy participation: With prompting, this patient actively participated in the group.     Therapeutic interventions reviewed and discussed: The group members were asked to   identify an emotion they are having and/or let the group know what they want to focus on for the   day as they continue to make discharge plans. The group members reviewed their feelings and goals for the day. They also reviewed the handout described above.      Impression of participation: The patient initially said she was feeling "cold" and that she was feeling, "alright, for coming off of heroin." She added that she was living in a local recovery house when members of the recovery house brought her to the ED. She talked about how hard it has been for her over the last few months, many of which were without clear financial support or consistent housing. She was particularly upset about her conviction as a felon and how difficult this conviction has made  finding a decent job, "I had wanted to go into [employer] and now I can't." The patient was alert, generally oriented, and engaged in the group process. She expressed no current SI/HI and displayed no overt psychotic symptoms in this group. She was active in the conversation with her peers, particularly after she shared about her own pain and questioning of meaningful hope for her career. She is intelligent and knows she would prefer not to waste her skills and potential anymore to her addiction. She also said she hoped she would be going back to the recovery house she was living before coming into the hospital and recognized that she probably would benefit from attending more AA [or NA] meetings. Her affect was depressed, anxious, and frightened. Her mood reflected her affect. This was the patient's first process group with the undersigned.

## 2017-06-20 NOTE — Behavioral Health Treatment Team (Signed)
CM Note: SW contacted the following agencies seeking in pt [place] - [facility] ( asked to fax documents to [phone]) for review [facility]- [facility] [phone]( [phone] & [facility] [phone]- [phone] all have waiting lists of 3-5 weeks and they are not taking referrals. SW will continue to follow up referral .  Update: SW has not heard from referral but left VM to return call.

## 2017-06-21 MED FILL — NICOTINE (POLACRILEX) 2 MG GUM: 2 mg | BUCCAL | Qty: 2

## 2017-06-21 MED FILL — CLONIDINE 0.1 MG TAB: 0.1 mg | ORAL | Qty: 1

## 2017-06-21 MED FILL — IBUPROFEN 400 MG TAB: 400 mg | ORAL | Qty: 1

## 2017-06-21 MED FILL — ZOLPIDEM 10 MG TAB: 10 mg | ORAL | Qty: 1

## 2017-06-21 MED FILL — PROMETHAZINE 25 MG TAB: 25 mg | ORAL | Qty: 1

## 2017-06-21 MED FILL — METHADONE 5 MG TAB: 5 mg | ORAL | Qty: 1

## 2017-06-21 NOTE — Behavioral Health Treatment Team (Signed)
Behavioral Health Transition Record to Provider    Patient Name: Terry Ho  Date of Birth: [birth year]  Medical Record Number: [id number]  Date of Admission: 06/18/2017  Date of Discharge: 06/21/2017    Attending Provider: Darolyn Rua, MD  Discharging Provider: Darolyn Rua, MD  To contact this individual call [phone] and ask the operator to page.  If unavailable, ask to be transferred to [facility] Provider on call.  [place] Provider will be available on call 24/7 and during holidays.    Primary Care Provider: Roetta Sessions, MD    No Known Allergies    Reason for Admission: Pt admitted under a voluntary basis for anxiety, substance use, uses heroin 1 to 60m daily and inability to care for self.     Admission Diagnosis: Substance induced mood disorder ([facility] [F19.94]    * No surgery found *    Results for orders placed or performed during the hospital encounter of 06/18/17   CULTURE, URINE   Result Value Ref Range    Special Requests: NO SPECIAL REQUESTS      Colony Count >100,000  COLONIES/mL        Culture result: MIXED UROGENITAL FLORA ISOLATED     DRUG SCREEN, URINE   Result Value Ref Range    AMPHETAMINES NEGATIVE  NEG      BARBITURATES NEGATIVE  NEG      BENZODIAZEPINES NEGATIVE  NEG      COCAINE NEGATIVE  NEG      METHADONE NEGATIVE  NEG      OPIATES POSITIVE (A) NEG      PCP(PHENCYCLIDINE) NEGATIVE  NEG      THC (TH-CANNABINOL) POSITIVE (A) NEG      Drug screen comment (NOTE)    ETHYL ALCOHOL   Result Value Ref Range    ALCOHOL(ETHYL),SERUM <<07<<37MG/DL   SALICYLATE   Result Value Ref Range    Salicylate level 1.8 (L) 2.8 - 20.0 MG/DL   ACETAMINOPHEN   Result Value Ref Range    Acetaminophen level <2 (L) 10 - 30 ug/mL   METABOLIC PANEL, COMPREHENSIVE   Result Value Ref Range    Sodium 141 136 - 145 mmol/L    Potassium 3.6 3.5 - 5.1 mmol/L    Chloride 102 97 - 108 mmol/L    CO2 29 21 - 32 mmol/L    Anion gap 10 5 - 15 mmol/L    Glucose 94 65 - 100 mg/dL    BUN 6 6 - 20 MG/DL     Creatinine 0.93 0.55 - 1.02 MG/DL    BUN/Creatinine ratio 6 (L) 12 - 20      GFR est AA >60 >60 ml/min/1.757m   GFR est non-AA >60 >60 ml/min/1.7376m  Calcium 9.3 8.5 - 10.1 MG/DL    Bilirubin, total 0.6 0.2 - 1.0 MG/DL    ALT (SGPT) 24 12 - 78 U/L    AST (SGOT) 17 15 - 37 U/L    Alk. phosphatase 78 45 - 117 U/L    Protein, total 8.8 (H) 6.4 - 8.2 g/dL    Albumin 4.1 3.5 - 5.0 g/dL    Globulin 4.7 (H) 2.0 - 4.0 g/dL    A-G Ratio 0.9 (L) 1.1 - 2.2     CBC WITH AUTOMATED DIFF   Result Value Ref Range    WBC 8.8 3.6 - 11.0 K/uL    RBC 4.72 3.80 - 5.20 M/uL    HGB 13.9 11.5 -  16.0 g/dL    HCT 42.4 35.0 - 47.0 %    MCV 89.8 80.0 - 99.0 FL    MCH 29.4 26.0 - 34.0 PG    MCHC 32.8 30.0 - 36.5 g/dL    RDW 12.6 11.5 - 14.5 %    PLATELET 272 150 - 400 K/uL    MPV 9.2 8.9 - 12.9 FL    NRBC 0.0 0 PER 100 WBC    ABSOLUTE NRBC 0.00 0.00 - 0.01 K/uL    NEUTROPHILS 63 32 - 75 %    LYMPHOCYTES 27 12 - 49 %    MONOCYTES 8 5 - 13 %    EOSINOPHILS 2 0 - 7 %    BASOPHILS 0 0 - 1 %    IMMATURE GRANULOCYTES 0 0.0 - 0.5 %    ABS. NEUTROPHILS 5.5 1.8 - 8.0 K/UL    ABS. LYMPHOCYTES 2.4 0.8 - 3.5 K/UL    ABS. MONOCYTES 0.7 0.0 - 1.0 K/UL    ABS. EOSINOPHILS 0.2 0.0 - 0.4 K/UL    ABS. BASOPHILS 0.0 0.0 - 0.1 K/UL    ABS. IMM. GRANS. 0.0 0.00 - 0.04 K/UL    DF AUTOMATED     URINALYSIS W/MICROSCOPIC   Result Value Ref Range    Color DARK YELLOW      Appearance CLOUDY (A) CLEAR      Specific gravity 1.022 1.003 - 1.030      pH (UA) 5.5 5.0 - 8.0      Protein 30 (A) NEG mg/dL    Glucose NEGATIVE  NEG mg/dL    Ketone TRACE (A) NEG mg/dL    Blood LARGE (A) NEG      Urobilinogen 1.0 0.2 - 1.0 EU/dL    Nitrites NEGATIVE  NEG      Leukocyte Esterase SMALL (A) NEG      WBC 5-10 0 - 4 /hpf    RBC >100 (H) 0 - 5 /hpf    Epithelial cells MODERATE (A) FEW /lpf    Bacteria 1+ (A) NEG /hpf   TSH 3RD GENERATION   Result Value Ref Range    TSH 2.04 0.36 - 3.74 uIU/mL   BILIRUBIN, CONFIRM   Result Value Ref Range    Bilirubin UA, confirm NEGATIVE  NEG      HCG URINE, QL. - POC   Result Value Ref Range    Pregnancy test,urine (POC) NEGATIVE  NEG     EKG, 12 LEAD, INITIAL   Result Value Ref Range    Ventricular Rate 85 BPM    Atrial Rate 85 BPM    P-R Interval 164 ms    QRS Duration 76 ms    Q-T Interval 342 ms    QTC Calculation (Bezet) 406 ms    Calculated P Axis 26 degrees    Calculated R Axis 54 degrees    Calculated T Axis 35 degrees    Diagnosis       Normal sinus rhythm  No previous ECGs available  Confirmed by Meda Coffee, M.D., Juanda Crumble [phone]) on 06/19/2017 8:20:47 AM         Immunizations administered during this encounter:   There is no immunization history on file for this patient.    Screening for Metabolic Disorders for Patients on Antipsychotic Medications  (Data obtained from the EMR)    Estimated Body Mass Index  Estimated body mass index is 27.79 kg/m?? as calculated from the following:    Height as of this encounter: 5' 4"  (  1.626 m).    Weight as of this encounter: 73.4 kg (161 lb 14.4 oz).     Vital Signs/Blood Pressure  Visit Vitals  BP 111/74 (BP 1 Location: Left arm, BP Patient Position: Sitting)   Pulse 83   Temp 98.5 ??F (36.9 ??C)   Resp 16   Ht 5' 4"  (1.626 m)   Wt 73.4 kg (161 lb 14.4 oz)   SpO2 99%   Breastfeeding? No   BMI 27.79 kg/m??       Blood Glucose/Hemoglobin A1c  Lab Results   Component Value Date/Time    Glucose 94 06/18/2017 11:26 PM     No results found for: HBA1C, HGBE8, HBA1CEXT     Lipid Panel  No results found for: CHOL, CHOLX, CHLST, CHOLV, 884269, HDL, LDL, LDLC, DLDLP, TGLX, TRIGL, TRIGP, CHHD, CHHDX     Discharge Diagnosis: Substance induced mood disorder (ICD-10-CM: F19.94)    Discharge Plan: Patient discharged back to her sober living house.  DISCHARGE SUMMARYNAME:Terry Ho  DOB: [birth year]  MRN: [id number]  The patient Terry Ho exhibits the ability to control behavior in a less restrictive environment.  Patient's level of functioning is improving.  No assaultive/destructive behavior has been observed for the  past 24 hours.  No suicidal/homicidal threat or behavior has been observed for the past 24 hours.  There is no evidence of serious medication side effects.  Patient has not been in physical or protective restraints for at least the past 24 hours.    If weapons involved, how are they secured?  No weapons involved.    Is patient aware of and in agreement with discharge plan?  Yes    Arrangements for medication:  No prescriptions given.     Copy of discharge instructions to provider?:  [place] for transportation home:  Sober living home to pick up.    Keep all follow up appointments as scheduled, continue to take prescribed medications per physician instructions.  Mental health crisis number:  376 or your local mental health crisis line number at [phone].    Discharge Medication List and Instructions:   Discharge Medication List as of 06/21/2017 12:56 PM      STOP taking these medications       traZODone (DESYREL) 100 mg tablet Comments:   Reason for Stopping:               Unresulted Labs (24h ago, onward)    None        To obtain results of studies pending at discharge, please contact [phone]    Follow-up Information     Follow up With Specialties Details Why Contact Info    Little, Kerrie [place], MD [facility]   [address]  [address]  [place] [address]  [phone]      [place] on 06/24/2017 Walk-in Monday through Wednesday between 7:30am and 3:00pm, Thursday between 7:30am and 5:30pm, or Friday between 7:30am and 11:00am to enroll in substance abuse treatment services through IOP (intensive outpatient program). [place]  [phone]          Advanced Directive:   Does the patient have an appointed surrogate decision maker? No  Does the patient have a Medical Advance Directive? No  Does the patient have a Psychiatric Advance Directive? No  If the patient does not have a surrogate or Medical Advance Directive AND  Psychiatric Advance Directive, the patient was offered  information on these advance directives Yes and Patient declined to complete    Patient Instructions: Please continue all medications until otherwise directed by physician.      Tobacco Cessation Discharge Plan:   Is the patient a smoker and needs referral for smoking cessation? Yes  Patient referred to the following for smoking cessation with an appointment? Refused     Patient was offered medication to assist with smoking cessation at discharge? Refused  Was education for smoking cessation added to the discharge instructions? Yes    Alcohol/Substance Abuse Discharge Plan:   Does the patient have a history of substance/alcohol abuse and requires a referral for treatment? Yes  Patient referred to the following for substance/alcohol abuse treatment with an appointment? Yes, Patient was referred to walk-in to [facility] on 06/24/17 at 7:30am.  Patient was offered medication to assist with alcohol cessation at discharge? Refused  Was education for substance/alcohol abuse added to discharge instructions? Yes    Patient discharged to Home; discussed with patient/caregiver and provided to the patient/caregiver either in hard copy or electronically.

## 2017-06-21 NOTE — Behavioral Health Treatment Team (Signed)
GROUP THERAPY PROGRESS NOTE    Terry Ho did not participate in a 75 minute Process Group on the General Unit with a focus identifying feelings, planning for the day, and reviewing DBT coping skills related to distress management.
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
## 2017-06-21 NOTE — Behavioral Health Treatment Team (Signed)
PSYCHIATRIC PROGRESS NOTE       Patient Name  Terry Ho   Date of Birth 12-Oct-1990   CSN [id number]   Medical Record Number  [id number]      Age  27 y.o.   PCP Little, Young Berry, MD   Admit date:  06/18/2017    Room Number  731/02  @ [facility] hospital   Date of Service  06/21/2017           E & M PROGRESS NOTE:         HISTORY       C  REASON FOR HOSPITALIZATION:  CC: "". Pt admitted under a voluntary basis for anxiety, substance use , uses heroin 1 to 2mg  daily and inability to care for self.    HISTORY OF PRESENT ILLNESS:    The patient, Terry Ho, is a 27 y.o.  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 Pt has h/o crack cocaine, meth, mushroom, LSD, mesculine use in the past. Pt report she has used everything which is available but her consistent use is of heroine and marijuana. Pt reports she wants to detox and want to go for substance abuse program. Pt is having heroin withdrawals as bodyaches and mood symptoms. Pt received methadone 30 mg yesterday and methadone 15 mg today , who presents at this time with complaints of (and/or evidence of) the following emotional symptoms: anxiety.  These symptoms are of high severity. These symptoms are constant  in nature.  The patient's condition has been precipitated by continued illicit drug use  UDS: THC and opiates posituive; BAL=0.  Terry Ho  currently denied suicidal/homicidal ideations and plans. Pt denied auditory and visual hallucinations.  As per initial ER notes:  ??  27 y.o. female with past medical history significant for substance abuse, presents ambulatory to the ED accompanied by a female companion for a mental health evaluation. Patient states that she feels both suicidal and homicidal. Notes that she has felt suicidal for the past three days; thinks this feeling was triggered by her substance abuse. Notes that she  is trying to keep clean and is currently living in a recovery house, but she recently relapsed on cocaine, heroin, and Xanax. Last heroin use was at 1815 this evening, last Xanax use was 3 mg earlier this morning. Patient notes that the house manager "pulled a lot (of drugs and paraphernalia) off me", and she is concerned that she is going to be kicked out of the house as a result. Patient states that she has a plan for suicide by "cutting my wrists"; states that she has attempted to commit suicide in the past by cutting her wrists.  ??   ??  ??  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 Pt has h/o crack cocaine, meth, mushroom, LSD, mesculine use in the past.  ??  Family History of mental illness: mother has mental illness.  ??  ??  Social History:  Patient is born and raised in [place].   Patient lives in recovery house  Pt is Chartered loss adjuster..  Legal issues: 9 months in the Jail.posession of drugs  ??  06/21/17: Pt reports feels much better, not having any withdrawals received methadone 15 mg today. Pt want to be discharged and want to start IOP program Pt is hopeful, has good mood, doing better, sleeping good, future oriented, ready to go home. Pt currently denied suicidal/homicidal ideations and plans. Pt denied auditory and visual hallucinations, Pt is compliant with the meds, no side effects to meds reported.  ??            SIDE EFFECTS: (reviewed/updated 06/21/2017)  None reported or admitted to.  No noted toxicity with use of Depakote/Tegretol/lithium/Clozaril/TCAs   ALLERGIES:(reviewed/updated 06/21/2017)  No Known Allergies   MEDICATIONS PRIOR TO ADMISSION:(reviewed/updated 06/21/2017)  Medications Prior to Admission   Medication Sig   ??? traZODone (DESYREL) 100 mg tablet Take 100 mg by mouth nightly.       PAST MEDICAL HISTORY: Past medical history from the initial psychiatric evaluation has been reviewed (reviewed/updated 06/21/2017) with no additional updates (I asked patient and no additional past medical history provided). Past Medical History:   Diagnosis Date   ??? Anxiety disorder    ??? Depression    ??? Substance abuse (HCC)    ??? Suicidal thoughts      Past Surgical History:   Procedure Laterality Date   ??? HX APPENDECTOMY     ??? HX TONSILLECTOMY        SOCIAL HISTORY: Social history from the initial psychiatric evaluation has been reviewed (reviewed/updated 06/21/2017) with no additional updates (I asked patient and no additional social history provided).   Social History     Socioeconomic History   ??? Marital status: SINGLE     Spouse name: Not on file   ??? Number of children: Not on file   ??? Years of education: Not on file   ??? Highest education level: Not on file   Social Needs   ??? Financial resource strain: Very hard   ??? Food insecurity - worry: Often true   ??? Food insecurity - inability: Often true   ??? Transportation needs - medical: Yes   ??? Transportation needs - non-medical: Yes   Occupational History   ??? Not on file   Tobacco Use   ??? Smoking status: Current Every Day Smoker     Packs/day: 2.00     Years: 10.00     Pack years: 20.00   ??? Smokeless tobacco: Never Used   Substance and Sexual Activity   ??? Alcohol use: No   ??? Drug use: Yes     Types: Heroin, Marijuana, Opiates   ??? Sexual activity: No   Other Topics Concern   ??? Military Service Not Asked   ??? Blood Transfusions Not Asked   ??? Caffeine Concern Not Asked   ??? Occupational Exposure Not Asked   ??? Hobby Hazards Not Asked   ??? Sleep Concern Yes   ??? Stress Concern Not Asked   ??? Weight Concern Not Asked   ??? Special Diet Not Asked   ??? Back Care Not Asked   ??? Exercise Not Asked   ??? Bike Helmet Not Asked   ??? Seat Belt Not Asked   ??? Self-Exams Not Asked   Social History Narrative   ??? Not on file       FAMILY HISTORY: Family history from the initial psychiatric evaluation has been reviewed (reviewed/updated 06/21/2017) with no additional updates (I asked patient and no additional family history provided). History reviewed. No pertinent family history.    REVIEW OF SYSTEMS: (reviewed/updated 06/21/2017)  Appetite:good  Sleep: good   All other Review of Systems: Negative except          MENTAL STATUS EXAM & VITALS     MENTAL STATUS EXAM (MSE):    MSE FINDINGS ARE WITHIN NORMAL LIMITS (WNL) UNLESS OTHERWISE STATED BELOW. ( ALL OF THE BELOW CATEGORIES OF THE MSE HAVE BEEN REVIEWED (reviewed 06/20/2017) AND UPDATED AS DEEMED APPROPRIATE )  General Presentation age appropriate and casually dressed, cooperative   Orientation oriented to time, place and person   Vital Signs  See below (reviewed 06/20/2017); Vital Signs (BP, Pulse, & Temp) are within normal limits if not listed below.   Gait and Station Stable/steady, no ataxia   Musculoskeletal System No extrapyramidal symptoms (EPS); no abnormal muscular movements or Tardive Dyskinesia (TD); muscle strength and tone are within normal limits   Language No aphasia or dysarthria   Speech:  normal pitch and normal volume   Thought Processes logical;   Thought Associations goal directed   Thought Content free of delusions   Suicidal Ideations no plan  and no intention   Homicidal Ideations no plan  and no intention   Mood:  improved   Affect:  improved   Memory recent  intact   Memory remote:  intact   Concentration/Attention:  distractable   Fund of Knowledge average   Insight:  good   Reliability good   Judgment:  fair              VITALS:     Patient Vitals for the past 24 hrs:   Temp Pulse Resp BP SpO2   06/21/17 1145 98.5 ??F (36.9 ??C) 83 16 111/74 99 %   06/21/17 0711 ??? 86 16 148/88 100 %   06/20/17 2004 98.3 ??F (36.8 ??C) 66 20 120/76 ???   06/20/17 1647 98.6 ??F (37 ??C) 85 20 (!) 160/99 ???   06/20/17 1422 98 ??F (36.7 ??C) 77 18 106/65 ???   06/20/17 1400 ??? ??? ??? 106/65 ???    06/20/17 1243 98.5 ??F (36.9 ??C) 77 18 125/85 100 %     Wt Readings from Last 3 Encounters:   06/19/17 73.4 kg (161 lb 14.4 oz)   12/18/16 77.7 kg (171 lb 3.2 oz)     Temp Readings from Last 3 Encounters:   06/21/17 98.5 ??F (36.9 ??C)   12/19/16 98.6 ??F (37 ??C)     BP Readings from Last 3 Encounters:   06/21/17 111/74   12/19/16 (!) 135/91     Pulse Readings from Last 3 Encounters:   06/21/17 83   12/19/16 (!) 108            DATA     LABORATORY DATA:(reviewed/updated 06/21/2017)  No results found for this or any previous visit (from the past 24 hour(s)).  No results found for: VALF2, VALAC, VALP, VALPR, DS6, CRBAM, CRBAMP, CARB2, XCRBAM  No results found for: LITHM   RADIOLOGY REPORTS:(reviewed/updated 06/21/2017)  Xr Chest Pa Lat    Result Date: 06/19/2017  History: Chest pain. Frontal and lateral views of the chest show clear lungs.  The heart, mediastinum and pulmonary vasculature are normal.  The bony thorax is unremarkable.     IMPRESSION: NORMAL CHEST.          MEDICATIONS     ALL MEDICATIONS:   Current Facility-Administered Medications   Medication Dose Route Frequency   ??? ondansetron (ZOFRAN) injection 4 mg  4 mg IntraVENous Q6H PRN   ??? loperamide (IMODIUM) capsule 2  mg  2 mg Oral Q4H PRN   ??? promethazine (PHENERGAN) tablet 25 mg  25 mg Oral Q6H PRN   ??? cloNIDine HCl (CATAPRES) tablet 0.1 mg  0.1 mg Oral TID   ??? nicotine (NICORETTE) gum 4 mg  4 mg Oral Q1H PRN   ??? ziprasidone (GEODON) 20 mg in sterile water (preservative free) 1 mL injection  20 mg IntraMUSCular BID PRN   ??? OLANZapine (ZyPREXA) tablet 5 mg  5 mg Oral Q6H PRN   ??? benztropine (COGENTIN) tablet 2 mg  2 mg Oral BID PRN   ??? benztropine (COGENTIN) injection 2 mg  2 mg IntraMUSCular BID PRN   ??? zolpidem (AMBIEN) tablet 10 mg  10 mg Oral QHS PRN   ??? acetaminophen (TYLENOL) tablet 650 mg  650 mg Oral Q4H PRN   ??? ibuprofen (MOTRIN) tablet 400 mg  400 mg Oral Q8H PRN   ??? magnesium hydroxide (MILK OF MAGNESIA) 400 mg/5 mL oral suspension 30 mL   30 mL Oral DAILY PRN      SCHEDULED MEDICATIONS:   Current Facility-Administered Medications   Medication Dose Route Frequency   ??? cloNIDine HCl (CATAPRES) tablet 0.1 mg  0.1 mg Oral TID        ASSESSMENT & PLAN     The patient, Terry Ho, is a 27 y.o.  female who presents at this time for treatment of the following diagnoses:  Patient Active Hospital Problem List:  Substance induced mood disorder (HCC) (06/19/2017) Opiate use d/o, benzodiazipine use d/o, cannabis us ed/o    Assessment: mood, benzo use nad heroine use    Plan: Opiate withdrawal protocol with methadone  Patient psycho educated   Get collaterals  Recommended: substance abuse rehab/outpatient treatment   Discussed side effects/benefits and risks of medications  Individual/milue therapy  06/21/17: Pt will be dc today to IOP [facility]  Pt will be discharged today as per SW instructions, prescriptions provided. Pt has no access to guns           I certify that this patient's inpatient psychiatric hospital services furnished since the previous certification were, and continue to be, required for treatment that could reasonably be expected to improve the patient's condition, or for diagnostic study, and that the patient continues to need, on a daily basis, active treatment furnished directly by or requiring the supervision of inpatient psychiatric facility personnel. In addition, the hospital records show that services furnished were intensive treatment services, admission or related services, or equivalent services.    EXPECTED DISCHARGE DATE/DAY: Today     DISPOSITION: Home       Signed By:   Fabio BeringHarpreet Aubrina Nieman, MD  06/21/2017

## 2017-06-21 NOTE — Progress Notes (Signed)
Problem: Falls - Risk of  Goal: *Absence of Falls  Document Schmid Fall Risk and appropriate interventions in the flowsheet.  Fall Risk Interventions:  Patient has been fall free since arriving on the general unit.  She is currently sleeping, no stress noted.            Medication Interventions: Teach patient to arise slowly         History of Falls Interventions: Investigate reason for fall

## 2017-06-21 NOTE — Other (Addendum)
Behavioral Health Interdisciplinary Rounds     Patient Name: Terry Ho  Age: 27 y.o.  Room/Bed:  731/02  Primary Diagnosis: <principal problem not specified>   Admission Status: Voluntary     Readmission within 30 days: no  Power of Attorney in place: no  Patient requires a blocked bed: no          Reason for blocked bed:     VTE Prophylaxis: No    Mobility needs/Fall risk: no  Flu Vaccine : no   Nutritional Plan: no  Consults:          Labs/Testing due today?: no    Sleep hours: 6.45       Participation in Care/Groups:  yes  Medication Compliant?: Yes  PRNS (last 24 hours): Anticholinergic    Restraints (last 24 hours):  no     CIWA (range last 24 hours):     COWS (range last 24 hours): COWS Total: 1    Alcohol screening (AUDIT) completed -   AUDIT Score: 0     If applicable, date SBIRT discussed in treatment team AND documented: N/A    Tobacco - patient is a smoker: Have You Used Tobacco in the Past 30 Days: Yes  Illegal Drugs use: Have You Used Any Illegal Substances Over the Past 12 Months: Yes    24 hour chart check complete: yes     Patient goal(s) for today: Discharge  Treatment team focus/goals: Discharge  Progress note: Patient is stable and ready for discharge     LOS:  2  Expected LOS: 2    Financial concerns/prescription coverage: Medicaid  Date of last family contact: None      Family requesting physician contact today: No  Discharge plan: Return home  Guns in the home: No       Outpatient provider(s): [facility]    Participating treatment team members: Terry MinceBrittany Pinnix, Valaria GoodKatherine Wallace, MSW; Dr. Maxwell Marioneeba, MD; Sheldon SilvanJessica Lurey, RN; Harless LittenAllie Terry, PharmD

## 2017-06-21 NOTE — Progress Notes (Signed)
Problem: Chemical Dependency (Adult/Pediatric)  Goal: *STG: Seeks staff when symptoms of withdrawal increase  Outcome: Progressing Towards Goal  COW 6 received last dose of scheduled methadone, has required multiple prn meds for symptoms management. Pt states withdrawal symptoms at times in intolerable. Voices concern about how to manage symptoms once discharged. Continues to voice desire to discharge today either to an inpatient or outpatient substance abuse treatment facility. Denies SI no self harming behaviors. Daily goal continues to be discharge. Staff focus is on offering support, reassurance and symptom management.

## 2017-06-21 NOTE — Progress Notes (Cosign Needed)
PT did not attend morning community and self-care meeting
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
